# Patient Record
Sex: Female | Born: 1935
Health system: Southern US, Community
[De-identification: ages and names within clinical notes are randomized; demographics above are authoritative.]

## PROBLEM LIST (undated history)

## (undated) DIAGNOSIS — E119 Type 2 diabetes mellitus without complications: Secondary | ICD-10-CM

## (undated) DIAGNOSIS — N289 Disorder of kidney and ureter, unspecified: Secondary | ICD-10-CM

## (undated) DIAGNOSIS — E785 Hyperlipidemia, unspecified: Secondary | ICD-10-CM

## (undated) DIAGNOSIS — I10 Essential (primary) hypertension: Secondary | ICD-10-CM

## (undated) DIAGNOSIS — I4891 Unspecified atrial fibrillation: Secondary | ICD-10-CM

## (undated) DIAGNOSIS — G629 Polyneuropathy, unspecified: Secondary | ICD-10-CM

## (undated) DIAGNOSIS — J45909 Unspecified asthma, uncomplicated: Secondary | ICD-10-CM

---

## 2010-08-13 DEATH — deceased

## 2019-06-29 ENCOUNTER — Ambulatory Visit: Payer: Medicare Other | Attending: Internal Medicine

## 2019-06-29 DIAGNOSIS — Z23 Encounter for immunization: Secondary | ICD-10-CM

## 2019-06-29 NOTE — Progress Notes (Signed)
   Covid-19 Vaccination Clinic  Name:  Linda Turner    MRN: [id number] DOB: May 04, 1934  06/29/2019  Linda Turner was observed post Covid-19 immunization for 15 minutes without incidence. She was provided with Vaccine Information Sheet and instruction to access the V-Safe system.   Linda Turner was instructed to call 911 with any severe reactions post vaccine: Marland Kitchen Difficulty breathing  . Swelling of your face and throat  . A fast heartbeat  . A bad rash all over your body  . Dizziness and weakness    Immunizations Administered    Name Date Dose VIS Date Route   Pfizer COVID-19 Vaccine 06/29/2019  3:29 PM 0.3 mL 04/24/2019 Intramuscular   Manufacturer: [employer], [employer]   Lot: [id number]   NDC: [address]-8

## 2019-07-21 ENCOUNTER — Ambulatory Visit: Payer: Medicare Other | Attending: Internal Medicine

## 2019-07-21 DIAGNOSIS — Z23 Encounter for immunization: Secondary | ICD-10-CM | POA: Insufficient documentation

## 2019-07-21 NOTE — Progress Notes (Signed)
   Covid-19 Vaccination Clinic  Name:  Linda Turner    MRN: [id number] DOB: [birth year]  07/21/2019  [name] was observed post Covid-19 immunization for 15 minutes without incident. She was provided with Vaccine Information Sheet and instruction to access the V-Safe system.   [name] was instructed to call 911 with any severe reactions post vaccine: Marland Kitchen Difficulty breathing  . Swelling of face and throat  . A fast heartbeat  . A bad rash all over body  . Dizziness and weakness   Immunizations Administered    Name Date Dose VIS Date Route   Pfizer COVID-19 Vaccine 07/21/2019  9:05 AM 0.3 mL 04/24/2019 Intramuscular   Manufacturer: [employer], [employer]   Lot: [id number]   NDC: [address]-7

## 2019-07-22 ENCOUNTER — Ambulatory Visit: Payer: Self-pay

## 2019-12-09 ENCOUNTER — Encounter (HOSPITAL_COMMUNITY): Payer: Self-pay | Admitting: *Deleted

## 2019-12-09 ENCOUNTER — Emergency Department (HOSPITAL_COMMUNITY): Payer: Medicare Other

## 2019-12-09 ENCOUNTER — Other Ambulatory Visit: Payer: Self-pay

## 2019-12-09 ENCOUNTER — Emergency Department (HOSPITAL_COMMUNITY)
Admission: EM | Admit: 2019-12-09 | Discharge: 2019-12-09 | Disposition: A | Discharge status: Home / Self Care | Payer: Medicare Other | Attending: Emergency Medicine | Admitting: Emergency Medicine

## 2019-12-09 DIAGNOSIS — Z7901 Long term (current) use of anticoagulants: Secondary | ICD-10-CM | POA: Diagnosis not present

## 2019-12-09 DIAGNOSIS — I4891 Unspecified atrial fibrillation: Secondary | ICD-10-CM | POA: Insufficient documentation

## 2019-12-09 DIAGNOSIS — J45909 Unspecified asthma, uncomplicated: Secondary | ICD-10-CM | POA: Diagnosis not present

## 2019-12-09 DIAGNOSIS — I10 Essential (primary) hypertension: Secondary | ICD-10-CM | POA: Insufficient documentation

## 2019-12-09 DIAGNOSIS — E119 Type 2 diabetes mellitus without complications: Secondary | ICD-10-CM | POA: Insufficient documentation

## 2019-12-09 DIAGNOSIS — R112 Nausea with vomiting, unspecified: Secondary | ICD-10-CM | POA: Diagnosis not present

## 2019-12-09 DIAGNOSIS — R002 Palpitations: Secondary | ICD-10-CM | POA: Diagnosis present

## 2019-12-09 HISTORY — DX: Polyneuropathy, unspecified: G62.9

## 2019-12-09 HISTORY — DX: Hyperlipidemia, unspecified: E78.5

## 2019-12-09 HISTORY — DX: Essential (primary) hypertension: I10

## 2019-12-09 HISTORY — DX: Unspecified asthma, uncomplicated: J45.909

## 2019-12-09 HISTORY — DX: Type 2 diabetes mellitus without complications: E11.9

## 2019-12-09 HISTORY — DX: Disorder of kidney and ureter, unspecified: N28.9

## 2019-12-09 HISTORY — DX: Unspecified atrial fibrillation: I48.91

## 2019-12-09 LAB — BASIC METABOLIC PANEL
Anion gap: 9 (ref 5–15)
BUN: 16 mg/dL (ref 8–23)
CO2: 25 mmol/L (ref 22–32)
Calcium: 9.4 mg/dL (ref 8.9–10.3)
Chloride: 106 mmol/L (ref 98–111)
Creatinine, Ser: 1.25 mg/dL — ABNORMAL HIGH (ref 0.44–1.00)
GFR calc Af Amer: 46 mL/min — ABNORMAL LOW (ref >60)
GFR calc non Af Amer: 39 mL/min — ABNORMAL LOW (ref >60)
Glucose, Bld: 254 mg/dL — ABNORMAL HIGH (ref 70–99)
Potassium: 4.7 mmol/L (ref 3.5–5.1)
Sodium: 140 mmol/L (ref 135–145)

## 2019-12-09 LAB — CBC
HCT: 37.3 % (ref 36.0–46.0)
Hemoglobin: 11.1 g/dL — ABNORMAL LOW (ref 12.0–15.0)
MCH: 26.4 pg (ref 26.0–34.0)
MCHC: 29.8 g/dL — ABNORMAL LOW (ref 30.0–36.0)
MCV: 88.6 fL (ref 80.0–100.0)
Platelets: 157 10*3/uL (ref 150–400)
RBC: 4.21 MIL/uL (ref 3.87–5.11)
RDW: 14.1 % (ref 11.5–15.5)
WBC: 5.3 10*3/uL (ref 4.0–10.5)
nRBC: 0 % (ref 0.0–0.2)

## 2019-12-09 LAB — MAGNESIUM: Magnesium: 1.8 mg/dL (ref 1.7–2.4)

## 2019-12-09 MED ORDER — SODIUM CHLORIDE 0.9% FLUSH
3.0000 mL | Freq: Once | INTRAVENOUS | Status: AC
  Administered 2019-12-09: 3 mL via INTRAVENOUS

## 2019-12-09 NOTE — Discharge Instructions (Addendum)
You were seen today for palpitations.  Continue your Xarelto.  Take your medications as prescribed and follow-up closely with her primary physician.

## 2019-12-09 NOTE — ED Triage Notes (Signed)
Pt arrives via [facility] from home. After dinner, started having palpitations and SOB. Denied pain. Emesis x 1 PTA. Initial afib 90-180's. CBG 233. 112/66, current hr 70-110. She has an IV in the left hand. She was given Cardizem 10mg  x 2 doses. 98% RA. Alert and oriented.

## 2019-12-09 NOTE — ED Notes (Signed)
Patient transported to X-ray 

## 2019-12-09 NOTE — ED Provider Notes (Signed)
[facility] EMERGENCY DEPARTMENT Provider Note   CSN: [id number] Arrival date & time: 12/09/19  0109     History Chief Complaint  Patient presents with  . Palpitations    Linda Turner is a 86 y.o. female.  HPI     This an 86 year old female with a history of atrial fibrillation, diabetes, hypertension, hyperlipidemia who presents with palpitations.  Per patient, she ate dinner around 930.  She subsequently developed some palpitations and shortness of breath.  She states she did not have any chest pain at that time but did feel short of breath.  She had one episode of nonbilious, nonbloody emesis.  Per EMS she was noted to be in A. fib with rates up to the 180s.  She was given Cardizem 10 mg x 2 doses which she tolerated well.  She currently is asymptomatic.  No chest pain or shortness of breath.  Has not noted any recent illnesses.  No fevers or lower extremity swelling that is new.  She is on Xarelto and reports compliance.  Past Medical History:  Diagnosis Date  . A-fib (HCC)   . Asthma   . Diabetes mellitus without complication (HCC)   . HTN (hypertension)   . Hyperlipidemia   . Neuropathy   . Renal disorder     There are no problems to display for this patient.   OB History   No obstetric history on file.     No family history on file.  Social History   Tobacco Use  . Smoking status: Not on file  Substance Use Topics  . Alcohol use: Not on file  . Drug use: Not on file    Home Medications Prior to Admission medications   Not on File    Allergies    Ampicillin  Review of Systems   Review of Systems  Constitutional: Negative for fever.  Respiratory: Positive for shortness of breath. Negative for chest tightness.   Cardiovascular: Positive for palpitations. Negative for chest pain and leg swelling.  Gastrointestinal: Positive for nausea and vomiting. Negative for abdominal pain.  Genitourinary: Negative for dysuria.  All other  systems reviewed and are negative.   Physical Exam Updated Vital Signs BP (!) 145/72   Pulse 64   Temp 97.7 F (36.5 C) (Oral)   Resp 16   Ht 1.727 m (5\' 8" )   Wt 82.1 kg   SpO2 98%   BMI 27.52 kg/m   Physical Exam Vitals and nursing note reviewed.  Constitutional:      Appearance: She is well-developed.     Comments: Elderly, nontoxic-appearing  HENT:     Head: Normocephalic and atraumatic.     Nose: Nose normal.     Mouth/Throat:     Mouth: Mucous membranes are moist.  Eyes:     Pupils: Pupils are equal, round, and reactive to light.  Cardiovascular:     Rate and Rhythm: Normal rate and regular rhythm.     Heart sounds: Normal heart sounds. No murmur heard.   Pulmonary:     Effort: Pulmonary effort is normal. No respiratory distress.     Breath sounds: No wheezing.  Abdominal:     General: Bowel sounds are normal.     Palpations: Abdomen is soft.     Tenderness: There is no abdominal tenderness.  Musculoskeletal:     Cervical back: Neck supple.     Comments: Trace bilateral lower extremity edema  Skin:    General: Skin is warm and  dry.  Neurological:     Mental Status: She is alert and oriented to person, place, and time.  Psychiatric:        Mood and Affect: Mood normal.     ED Results / Procedures / Treatments   Labs (all labs ordered are listed, but only abnormal results are displayed) Labs Reviewed  BASIC METABOLIC PANEL - Abnormal; Notable for the following components:      Result Value   Glucose, Bld 254 (*)    Creatinine, Ser 1.25 (*)    GFR calc non Af Amer 39 (*)    GFR calc Af Amer 46 (*)    All other components within normal limits  CBC - Abnormal; Notable for the following components:   Hemoglobin 11.1 (*)    MCHC 29.8 (*)    All other components within normal limits  MAGNESIUM    EKG EKG Interpretation  Date/Time:  Wednesday December 09 2019 01:14:14 EDT Ventricular Rate:  83 PR Interval:    QRS Duration: 84 QT Interval:  380 QTC  Calculation: 446 R Axis:   14 Text Interpretation: Atrial fibrillation Cannot rule out Anterior infarct , age undetermined Abnormal ECG Confirmed by Ross Marcus ([id number]) on 12/09/2019 1:27:31 AM   EKG Interpretation  Date/Time:  Wednesday December 09 2019 02:04:41 EDT Ventricular Rate:  69 PR Interval:    QRS Duration: 88 QT Interval:  404 QTC Calculation: 433 R Axis:   32 Text Interpretation: Sinus rhythm Low voltage, precordial leads Confirmed by Ross Marcus ([id number]) on 12/09/2019 3:17:58 AM        Radiology DG Chest 2 View  Result Date: 12/09/2019 CLINICAL DATA:  AFib EXAM: CHEST - 2 VIEW COMPARISON:  June 21, 2017 FINDINGS: The heart size and mediastinal contours are within normal limits. Aortic knob calcifications are seen. Both lungs are clear. The visualized skeletal structures are unremarkable. IMPRESSION: No active cardiopulmonary disease. Electronically Signed   By: Jonna Clark M.D.   On: 12/09/2019 02:01    Procedures Procedures (including critical care time)  Medications Ordered in ED Medications  sodium chloride flush (NS) 0.9 % injection 3 mL (3 mLs Intravenous Given 12/09/19 0201)    ED Course  I have reviewed the triage vital signs and the nursing notes.  Pertinent labs & imaging results that were available during my care of the patient were reviewed by me and considered in my medical decision making (see chart for details).    MDM Rules/Calculators/A&P                          Patient presents with palpitations.  Noted to be in atrial fibrillation with RVR.  Was treated with bolus x2 by EMS and appears to be in sinus rhythm on my assessment.  She is currently asymptomatic.  She is overall nontoxic-appearing and vital signs are reassuring.  Heart rate is in the 60s.  She denies any chest pain or any other significant complaints.  Suspect her palpitations likely related to noted atrial fibrillation by EMS.  Will obtain labs to evaluate for metabolic  derangements or other obvious abnormalities.  She has no evidence of ischemia on her EKG and do not feel she needs a troponin as she is otherwise asymptomatic.  Chest x-ray does not show any evidence of pneumothorax or pneumonia.  Basic lab work notable for mild hyperglycemia and a creatinine of 1.25.  Patient has remained hemodynamically stable and in sinus rhythm while in the  emergency department.  She is asymptomatic.  Feel she is safe for discharge home.  Encouraged to continue her Xarelto and rate controlling medications and close follow-up with her primary physician.  This patients CHA2DS2-VASc Score and unadjusted Ischemic Stroke Rate (% per year) is equal to 7.2 % stroke rate/year from a score of 5  Above score calculated as 1 point each if present [CHF, HTN, DM, Vascular=MI/PAD/Aortic Plaque, Age if 65-74, or Female] Above score calculated as 2 points each if present [Age > 75, or Stroke/TIA/TE]  Final Clinical Impression(s) / ED Diagnoses Final diagnoses:  Atrial fibrillation with RVR (HCC)    Rx / DC Orders ED Discharge Orders         Ordered    Amb referral to AFIB Clinic     Discontinue  Reprint     12/09/19 0128           Shon Baton, MD 12/09/19 [phone]

## 2019-12-16 ENCOUNTER — Ambulatory Visit (HOSPITAL_COMMUNITY): Payer: Medicare Other | Admitting: Physician Assistant

## 2020-04-19 ENCOUNTER — Other Ambulatory Visit (HOSPITAL_BASED_OUTPATIENT_CLINIC_OR_DEPARTMENT_OTHER): Payer: Self-pay | Admitting: Internal Medicine

## 2020-04-19 ENCOUNTER — Ambulatory Visit: Payer: Medicare Other

## 2020-04-26 MED FILL — PFIZER-BIONTECH COVID-19 VA: 30 | 1 days supply | Qty: 0 | Fill #0

## 2020-05-02 ENCOUNTER — Ambulatory Visit: Payer: Medicare Other

## 2020-05-03 ENCOUNTER — Ambulatory Visit: Payer: Medicare Other

## 2022-01-11 IMAGING — CR DG CHEST 2V
1 series · 1 of 1 positions shown · non-contrast
Comparison: June 21, 2017

CLINICAL DATA: AFib

EXAM:
CHEST - 2 VIEW

[chest pa]
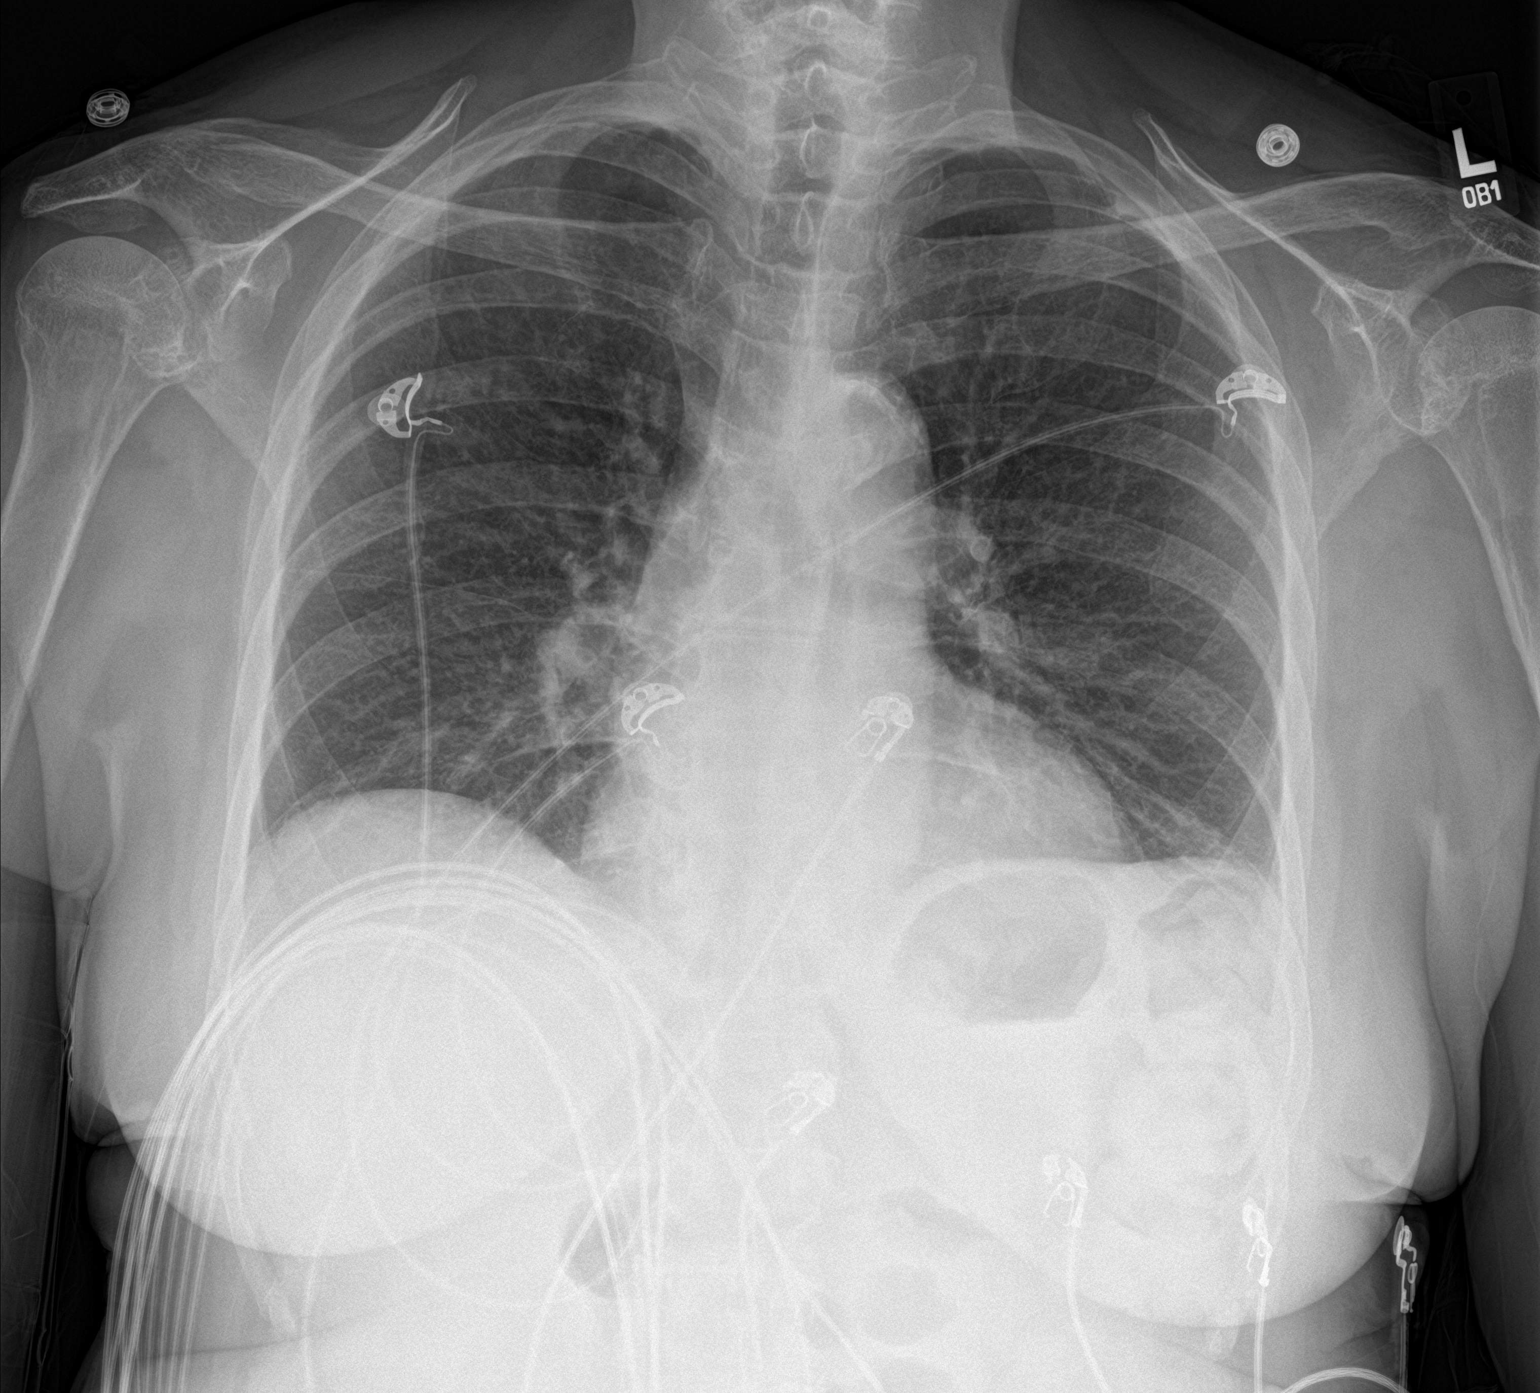

[1 of 1 positions shown; findings below may reference images not displayed]

FINDINGS: The heart size and mediastinal contours are within normal limits.
Aortic knob calcifications are seen. Both lungs are clear. The
visualized skeletal structures are unremarkable.
IMPRESSION: No active cardiopulmonary disease.

## 2022-03-29 ENCOUNTER — Emergency Department (HOSPITAL_BASED_OUTPATIENT_CLINIC_OR_DEPARTMENT_OTHER): Payer: Medicare Other

## 2022-03-29 ENCOUNTER — Emergency Department (HOSPITAL_BASED_OUTPATIENT_CLINIC_OR_DEPARTMENT_OTHER)
Admission: EM | Admit: 2022-03-29 | Discharge: 2022-03-29 | Disposition: A | Discharge status: Home / Self Care | Payer: Medicare Other | Attending: Emergency Medicine | Admitting: Emergency Medicine

## 2022-03-29 ENCOUNTER — Encounter (HOSPITAL_BASED_OUTPATIENT_CLINIC_OR_DEPARTMENT_OTHER): Payer: Self-pay

## 2022-03-29 DIAGNOSIS — M19072 Primary osteoarthritis, left ankle and foot: Secondary | ICD-10-CM | POA: Diagnosis not present

## 2022-03-29 DIAGNOSIS — I10 Essential (primary) hypertension: Secondary | ICD-10-CM | POA: Insufficient documentation

## 2022-03-29 DIAGNOSIS — R799 Abnormal finding of blood chemistry, unspecified: Secondary | ICD-10-CM | POA: Insufficient documentation

## 2022-03-29 DIAGNOSIS — M19079 Primary osteoarthritis, unspecified ankle and foot: Secondary | ICD-10-CM

## 2022-03-29 DIAGNOSIS — M79672 Pain in left foot: Secondary | ICD-10-CM | POA: Diagnosis present

## 2022-03-29 LAB — CBG MONITORING, ED: Glucose-Capillary: 86 mg/dL (ref 70–99)

## 2022-03-29 MED ORDER — HYDROCHLOROTHIAZIDE 25 MG PO TABS
25.0000 mg | ORAL_TABLET | Freq: Once | ORAL | Status: AC
  Administered 2022-03-29: 25 mg via ORAL
  Filled 2022-03-29: qty 1

## 2022-03-29 MED ORDER — LOSARTAN POTASSIUM 25 MG PO TABS
50.0000 mg | ORAL_TABLET | Freq: Once | ORAL | Status: AC
  Administered 2022-03-29: 50 mg via ORAL
  Filled 2022-03-29: qty 2

## 2022-03-29 NOTE — ED Triage Notes (Signed)
Patient c/o left foot pain x 2 months, strong pedal pulse. Denies injury. Also states when she swallows she has been getting choked up for the past 2 months. NAD during triage.  Hypertensive during triage, states taking meds, denies symptoms.

## 2022-03-29 NOTE — Discharge Instructions (Addendum)
Please take your blood pressure medicine as prescribed.  Please recheck your blood pressure with your doctor in a week  As we discussed, you have bad arthritis in your foot causing your pain.  Please call your podiatrist, Dr. Romualdo Bolk, tomorrow for appointment  Return to ER if you have worse foot pain, chest pain, trouble breathing, trouble swallowing

## 2022-03-29 NOTE — ED Notes (Signed)
Reviewed discharge instructions and recommendations to follow up with podiatrist. Pt states understanding. Pt ambulatory upon discharge .

## 2022-03-29 NOTE — ED Provider Notes (Signed)
[facility] EMERGENCY DEPARTMENT Provider Note   CSN: [id number] Arrival date & time: 03/29/22  1318     History  Chief Complaint  Patient presents with   Foot Pain    Linda Turner is a 88 y.o. female hx of bunion, hypertension, foot arthritis here presenting with left foot pain.  Patient states that she has been having left foot pain for the last 2 months.  Patient denies any trauma or injury.  Patient has a history of reflux and she told the nurse that she is getting worsening reflux symptoms but denies it to me.  Patient has a history of hypertension and did not take her blood pressure medicines today.  Denies any chest pain or shortness of breath.  Patient has a podiatrist that she follows up for her foot pain  The history is provided by the patient.       Home Medications Prior to Admission medications   Not on File      Allergies    Ampicillin    Review of Systems   Review of Systems  Musculoskeletal:        L foot pain   All other systems reviewed and are negative.   Physical Exam Updated Vital Signs BP (!) 180/83 (BP Location: Right Arm)   Pulse (!) 55   Temp 98.2 F (36.8 C) (Oral)   Resp 16   Ht 5\' 8"  (1.727 m)   Wt 72.6 kg   SpO2 98%   BMI 24.33 kg/m  Physical Exam Vitals and nursing note reviewed.  Constitutional:      Appearance: Normal appearance.     Comments: Chronically ill but not acutely ill   HENT:     Head: Normocephalic.     Nose: Nose normal.     Mouth/Throat:     Mouth: Mucous membranes are moist.  Eyes:     Extraocular Movements: Extraocular movements intact.     Pupils: Pupils are equal, round, and reactive to light.  Cardiovascular:     Rate and Rhythm: Normal rate and regular rhythm.     Pulses: Normal pulses.  Pulmonary:     Effort: Pulmonary effort is normal.     Breath sounds: Normal breath sounds.  Abdominal:     General: Abdomen is flat.     Palpations: Abdomen is soft.  Musculoskeletal:     Cervical  back: Normal range of motion and neck supple.     Comments: No obvious foot tenderness.  2+ DP pulse bilateral foot.  No calf tenderness.  Neurological:     General: No focal deficit present.     Mental Status: She is alert.  Psychiatric:        Mood and Affect: Mood normal.        Behavior: Behavior normal.     ED Results / Procedures / Treatments   Labs (all labs ordered are listed, but only abnormal results are displayed) Labs Reviewed  CBG MONITORING, ED    EKG None  Radiology DG Foot Complete Left  Result Date: 03/29/2022 CLINICAL DATA:  Left foot pain EXAM: LEFT FOOT - COMPLETE 3+ VIEW COMPARISON:  None Available. FINDINGS: No recent fracture is seen. Deformity in the head of the first metatarsal may be residual from previous injury or previous surgery. Degenerative changes are noted in first metatarsophalangeal joint. There is dislocation in PIP joint of the fifth toe which may be chronic. Small plantar spur is seen in calcaneus. IMPRESSION: No recent fracture or  dislocation is seen. Degenerative changes are noted in first metatarsophalangeal joint. Small plantar spur is seen in calcaneus. Electronically Signed   By: Ernie Avena M.D.   On: 03/29/2022 14:01    Procedures Procedures    Medications Ordered in ED Medications  hydrochlorothiazide (HYDRODIURIL) tablet 25 mg (has no administration in time range)  losartan (COZAAR) tablet 50 mg (has no administration in time range)    ED Course/ Medical Decision Making/ A&P                           Medical Decision Making Linda Turner is a 88 y.o. female here with foot pain.  Patient has chronic foot pain that got worse.  Patient is neurovascular intact.  X-ray showed arthritis and no fracture.  Patient has no trauma or injury.  Of note, patient is hypertensive but did not take her BP meds.  She has no chest pain or shortness of breath.  She told the nurse she has some trouble swallowing but denies that for me.  She was  able to swallow her meds.  I told her that she needs to take her blood pressure medicine as prescribed and follow-up with her doctor in the week.   Problems Addressed: Arthritis of foot: acute illness or injury Hypertension, unspecified type: acute illness or injury  Amount and/or Complexity of Data Reviewed Labs: ordered. Decision-making details documented in ED Course. Radiology: ordered and independent interpretation performed. Decision-making details documented in ED Course.  Risk Prescription drug management.    Final Clinical Impression(s) / ED Diagnoses Final diagnoses:  None    Rx / DC Orders ED Discharge Orders     None         Charlynne Pander, MD 03/29/22 [phone]

## 2022-03-29 NOTE — ED Notes (Signed)
ED Provider at bedside. 

## 2022-03-29 NOTE — ED Notes (Signed)
Pt able to stand and pivot from locked wheelchair to lowered and locked stretcher via x1 MIN assist, in NAD

## 2023-03-22 ENCOUNTER — Emergency Department (HOSPITAL_BASED_OUTPATIENT_CLINIC_OR_DEPARTMENT_OTHER): Payer: Medicare Other

## 2023-03-22 ENCOUNTER — Encounter (HOSPITAL_BASED_OUTPATIENT_CLINIC_OR_DEPARTMENT_OTHER): Payer: Self-pay

## 2023-03-22 ENCOUNTER — Emergency Department (HOSPITAL_BASED_OUTPATIENT_CLINIC_OR_DEPARTMENT_OTHER)
Admission: EM | Admit: 2023-03-22 | Discharge: 2023-03-23 | Disposition: A | Discharge status: Other Acute Inpatient Hospital | Payer: Medicare Other | Attending: Emergency Medicine | Admitting: Emergency Medicine

## 2023-03-22 ENCOUNTER — Other Ambulatory Visit: Payer: Self-pay

## 2023-03-22 DIAGNOSIS — R21 Rash and other nonspecific skin eruption: Secondary | ICD-10-CM | POA: Insufficient documentation

## 2023-03-22 DIAGNOSIS — I10 Essential (primary) hypertension: Secondary | ICD-10-CM | POA: Diagnosis not present

## 2023-03-22 DIAGNOSIS — E119 Type 2 diabetes mellitus without complications: Secondary | ICD-10-CM | POA: Diagnosis not present

## 2023-03-22 DIAGNOSIS — R002 Palpitations: Secondary | ICD-10-CM | POA: Diagnosis present

## 2023-03-22 DIAGNOSIS — I4891 Unspecified atrial fibrillation: Secondary | ICD-10-CM | POA: Insufficient documentation

## 2023-03-22 DIAGNOSIS — D649 Anemia, unspecified: Secondary | ICD-10-CM | POA: Diagnosis not present

## 2023-03-22 DIAGNOSIS — R410 Disorientation, unspecified: Secondary | ICD-10-CM | POA: Diagnosis not present

## 2023-03-22 LAB — CBC
HCT: 33 % — ABNORMAL LOW (ref 36.0–46.0)
Hemoglobin: 10.3 g/dL — ABNORMAL LOW (ref 12.0–15.0)
MCH: 26.3 pg (ref 26.0–34.0)
MCHC: 31.2 g/dL (ref 30.0–36.0)
MCV: 84.2 fL (ref 80.0–100.0)
Platelets: 183 10*3/uL (ref 150–400)
RBC: 3.92 MIL/uL (ref 3.87–5.11)
RDW: 14.9 % (ref 11.5–15.5)
WBC: 4 10*3/uL (ref 4.0–10.5)
nRBC: 0 % (ref 0.0–0.2)

## 2023-03-22 LAB — TROPONIN I (HIGH SENSITIVITY)
Troponin I (High Sensitivity): 16 ng/L (ref <18)
Troponin I (High Sensitivity): 24 ng/L — ABNORMAL HIGH (ref <18)

## 2023-03-22 LAB — BASIC METABOLIC PANEL
Anion gap: 10 (ref 5–15)
BUN: 21 mg/dL (ref 8–23)
CO2: 23 mmol/L (ref 22–32)
Calcium: 8.4 mg/dL — ABNORMAL LOW (ref 8.9–10.3)
Chloride: 106 mmol/L (ref 98–111)
Creatinine, Ser: 1.08 mg/dL — ABNORMAL HIGH (ref 0.44–1.00)
GFR, Estimated: 50 mL/min — ABNORMAL LOW (ref >60)
Glucose, Bld: 156 mg/dL — ABNORMAL HIGH (ref 70–99)
Potassium: 3.6 mmol/L (ref 3.5–5.1)
Sodium: 139 mmol/L (ref 135–145)

## 2023-03-22 LAB — TSH: TSH: 0.855 u[IU]/mL (ref 0.350–4.500)

## 2023-03-22 MED ORDER — DILTIAZEM LOAD VIA INFUSION
20.0000 mg | Freq: Once | INTRAVENOUS | Status: AC
  Administered 2023-03-22: 20 mg via INTRAVENOUS
  Filled 2023-03-22: qty 20

## 2023-03-22 MED ORDER — HEPARIN (PORCINE) 25000 UT/250ML-% IV SOLN
1000.0000 [IU]/h | INTRAVENOUS | Status: DC
  Administered 2023-03-22: 1000 [IU]/h via INTRAVENOUS
  Filled 2023-03-22: qty 250

## 2023-03-22 MED ORDER — IOHEXOL 350 MG/ML SOLN
100.0000 mL | Freq: Once | INTRAVENOUS | Status: AC | PRN
  Administered 2023-03-22: 75 mL via INTRAVENOUS

## 2023-03-22 MED ORDER — DILTIAZEM HCL-DEXTROSE 125-5 MG/125ML-% IV SOLN (PREMIX)
5.0000 mg/h | INTRAVENOUS | Status: DC
  Administered 2023-03-22: 5 mg/h via INTRAVENOUS
  Filled 2023-03-22: qty 125

## 2023-03-22 MED ORDER — HEPARIN BOLUS VIA INFUSION
3500.0000 [IU] | Freq: Once | INTRAVENOUS | Status: AC
  Administered 2023-03-22: 3500 [IU] via INTRAVENOUS

## 2023-03-22 NOTE — ED Provider Notes (Cosign Needed Addendum)
[facility] Provider Note   CSN: [id number] Arrival date & time: 03/22/23  1716     History  Chief Complaint  Patient presents with   Palpitations    Linda Turner is a 89 y.o. female with a past medical history of atrial fibrillation, HTN, DM, and HLD who presented to emergency department with palpitations that started a few hours ago.  Patient is unsure if she took her Cardizem today.  Patient stated that she had chest pain earlier today but denies chest pain at the moment, she denies shortness of breath and dizziness.  Patient denies recent illnesses and also denies taking blood thinners. Her last dispense h/o of Xarelto was in June 2023.   Patient presented with her sister and a caregiver who report an increase in confusion in the patient.  Patient also has a bag of medications with her with prescription medications that are greater than 69-year-old and an empty bottle for metoprolol.    Home Medications Prior to Admission medications   Not on File      Allergies    Ampicillin    Review of Systems   Review of Systems  Respiratory:  Negative for shortness of breath.   Cardiovascular:  Positive for palpitations. Negative for chest pain (Earlier, resolved) and leg swelling.  Neurological:  Negative for dizziness and light-headedness.    Physical Exam Updated Vital Signs BP (!) 127/91   Pulse 79   Temp 98.7 F (37.1 C) (Oral)   Resp 11   Ht 5\' 8"  (1.727 m)   Wt 72 kg   SpO2 99%   BMI 24.14 kg/m  Physical Exam Vitals and nursing note reviewed.  Constitutional:      General: She is not in acute distress.    Appearance: She is normal weight. She is not ill-appearing, toxic-appearing or diaphoretic.  Cardiovascular:     Rate and Rhythm: Tachycardia present. Rhythm irregularly irregular.     Heart sounds: Normal heart sounds. No murmur heard. Pulmonary:     Effort: Pulmonary effort is normal. No respiratory distress.      Breath sounds: Normal breath sounds.  Abdominal:     Palpations: Abdomen is soft.     Tenderness: There is no abdominal tenderness.  Musculoskeletal:     Right lower leg: No edema.     Left lower leg: No edema.  Skin:    General: Skin is warm and dry.     Findings: Rash (B/L venous stasis dermatitis present) present.  Neurological:     Mental Status: She is alert.     ED Results / Procedures / Treatments   Labs (all labs ordered are listed, but only abnormal results are displayed) Labs Reviewed  BASIC METABOLIC PANEL - Abnormal; Notable for the following components:      Result Value   Glucose, Bld 156 (*)    Creatinine, Ser 1.08 (*)    Calcium 8.4 (*)    GFR, Estimated 50 (*)    All other components within normal limits  CBC - Abnormal; Notable for the following components:   Hemoglobin 10.3 (*)    HCT 33.0 (*)    All other components within normal limits  TROPONIN I (HIGH SENSITIVITY) - Abnormal; Notable for the following components:   Troponin I (High Sensitivity) 24 (*)    All other components within normal limits  TSH  HEPARIN LEVEL (UNFRACTIONATED)  TROPONIN I (HIGH SENSITIVITY)    EKG EKG Interpretation Date/Time:  Friday March 22 2023 17:26:49 EST Ventricular Rate:  144 PR Interval:    QRS Duration:  89 QT Interval:  308 QTC Calculation: 477 R Axis:   26  Text Interpretation: Atrial fibrillation with rapid V-rate hx of paroxysmal afib.   Now in afib. Confirmed by Jacalyn Lefevre [phone]) on 03/22/2023 6:10:42 PM  Radiology CT Angio Chest PE W/Cm &/Or Wo Cm  Result Date: 03/22/2023 CLINICAL DATA:  Chest pain. EXAM: CT ANGIOGRAPHY CHEST WITH CONTRAST TECHNIQUE: Multidetector CT imaging of the chest was performed using the standard protocol during bolus administration of intravenous contrast. Multiplanar CT image reconstructions and MIPs were obtained to evaluate the vascular anatomy. RADIATION DOSE REDUCTION: This exam was performed according to the  departmental dose-optimization program which includes automated exposure control, adjustment of the mA and/or kV according to patient size and/or use of iterative reconstruction technique. CONTRAST:  75mL OMNIPAQUE IOHEXOL 350 MG/ML SOLN COMPARISON:  May 02, 2017 FINDINGS: Cardiovascular: There is marked severity calcification of the aortic arch, without evidence of aortic aneurysm. Satisfactory opacification of the pulmonary arteries to the segmental level. No evidence of pulmonary embolism. Normal heart size with marked severity coronary artery calcification. No pericardial effusion. Mediastinum/Nodes: There is mild right hilar lymphadenopathy. Thyroid gland, trachea, and esophagus demonstrate no significant findings. Lungs/Pleura: Numerous small, predominately peripheral nodular appearing multifocal infiltrates are seen throughout the bilateral upper lobes and bilateral lower lobes. No pleural effusion or pneumothorax is identified. Upper Abdomen: No acute abnormality. Musculoskeletal: Multilevel degenerative changes are seen throughout the thoracic spine Review of the MIP images confirms the above findings. IMPRESSION: 1. No evidence of pulmonary embolism. 2. Numerous small, predominately peripheral nodular appearing multifocal infiltrates throughout the bilateral upper lobes and bilateral lower lobes. Follow-up to resolution is recommended to exclude the presence of an underlying neoplastic process. 3. Mild right hilar lymphadenopathy, likely reactive in nature. 4. Aortic atherosclerosis. Aortic Atherosclerosis (ICD10-I70.0). Electronically Signed   By: Aram Candela M.D.   On: 03/22/2023 21:53   DG Chest Portable 1 View  Result Date: 03/22/2023 CLINICAL DATA:  Palpitations and confusion. EXAM: PORTABLE CHEST 1 VIEW COMPARISON:  August 09, 2022 FINDINGS: The heart size and mediastinal contours are within normal limits. There is marked severity calcification of the aortic arch. Low lung volumes are  seen with mild atelectasis noted within the bilateral lung bases. No pleural effusion or pneumothorax is identified. The visualized skeletal structures are unremarkable. IMPRESSION: Low lung volumes with mild bibasilar atelectasis. Electronically Signed   By: Aram Candela M.D.   On: 03/22/2023 21:40   CT Head Wo Contrast  Result Date: 03/22/2023 CLINICAL DATA:  Confusion with atrial fibrillation EXAM: CT HEAD WITHOUT CONTRAST TECHNIQUE: Contiguous axial images were obtained from the base of the skull through the vertex without intravenous contrast. RADIATION DOSE REDUCTION: This exam was performed according to the departmental dose-optimization program which includes automated exposure control, adjustment of the mA and/or kV according to patient size and/or use of iterative reconstruction technique. COMPARISON:  CT head 06/21/2017 FINDINGS: Brain: No intracranial hemorrhage, mass effect, or evidence of acute infarct. No hydrocephalus. No extra-axial fluid collection. Age-commensurate cerebral atrophy and chronic small vessel ischemic disease. Vascular: No hyperdense vessel. Intracranial arterial calcification. Skull: No fracture or focal lesion. Sinuses/Orbits: No acute finding. Other: None. IMPRESSION: No acute intracranial abnormality. Electronically Signed   By: Minerva Fester M.D.   On: 03/22/2023 21:37    Procedures Procedures    Medications Ordered in ED Medications  diltiazem (CARDIZEM) 1 mg/mL load  via infusion 20 mg (20 mg Intravenous Bolus from Bag 03/22/23 1801)    And  diltiazem (CARDIZEM) 125 mg in dextrose 5% 125 mL (1 mg/mL) infusion (5 mg/hr Intravenous New Bag/Given 03/22/23 1800)  heparin ADULT infusion 100 units/mL (25000 units/267mL) (1,000 Units/hr Intravenous New Bag/Given 03/22/23 1819)  heparin bolus via infusion 3,500 Units (3,500 Units Intravenous Bolus from Bag 03/22/23 1819)  iohexol (OMNIPAQUE) 350 MG/ML injection 100 mL (75 mLs Intravenous Contrast Given 03/22/23 2047)     ED Course/ Medical Decision Making/ A&P                                 Medical Decision Making Patient is an 89 year old female with a history of Atrial fibrillation who is noncompliant with rate control and anticoagulation medications likely from decreased cognition who presented to the ED for palpitations. Vitals on presentation are tachycardic at 142, blood pressure 126/84, afebrile, and O2 saturation of 98% on RA. Psychical exam was remarkable for an irregularly irregular rhythm. Patient was diagnosed with atrial fibrillation with rapid ventricular response, due to noncompliance with blood thinners, patient was not cardioverted. We started a Cardizem bolus and drip, along with a heparin infusion for anticoagulation.   Initial work up: CBC showed anemia with Hgb of 10.3 without leukocytosis, BMP showed Scr of 1.08, troponin 16, TSH pending, CXR showed low lung volumes with mild bibasilar atelectasis, and EKG showed Afib with RVR. Additional testing per request of Dr. Larey Brick at [facility]: head CT non contrast did not show acute intracranial abnormality and chest CTA did not show a PE but did show numerous small, predominately peripheral nodular appearing multifocal infiltrates throughout the bilateral upper lobes and bilateral lower lobes. Follow-up to resolution is recommended to exclude the presence of an underlying neoplastic process.  Patient will be admitted for further management of Afib with RVR pending [facility] acceptance.   Amount and/or Complexity of Data Reviewed Labs: ordered.    Details: CBC: Hgb 10.3, no leukocytosis BMP: Scr 1.08 Troponin: 16 TSH: 0.855  Radiology: ordered and independent interpretation performed.    Details: Agree with radiology findings, pt will need follow up chest imaging due to numerous nodular appearing infiltrates  ECG/medicine tests: ordered and independent interpretation performed.    Details: Afib with  RVR  Risk Prescription drug management.          Final Clinical Impression(s) / ED Diagnoses Final diagnoses:  Atrial fibrillation with rapid ventricular response [facility])    Rx / DC Orders ED Discharge Orders     None         Faith Rogue, DO 03/22/23 2239    Faith Rogue, DO 03/22/23 2317    Jacalyn Lefevre, MD 03/23/23 1443

## 2023-03-22 NOTE — ED Triage Notes (Addendum)
The patient stated her heart was racing. No chest pain or shortness of breath. Her family stated she has had increased confusion since she woke up today. She stated the patient was complaining of chest pain earlier.

## 2023-03-22 NOTE — ED Notes (Signed)
Report attempted to [facility] x 1.

## 2023-03-22 NOTE — Discharge Instructions (Addendum)
You came into the hospital with palpitations and you are diagnosed with atrial fibrillation with rapid ventricular response which is an abnormal rhythm of the heart.  You were treated with Cardizem bolus and infusion.  You were for admitted for further management of the abnormal heart rhythm. Please follow up with your PCP after you are discharged from the hospital. You will need follow up imaging of you lungs.

## 2023-03-22 NOTE — ED Notes (Signed)
BP 102/81 (90). Dr. Particia Nearing informed and stated it is okay to continue with the administration of the Cardizem infusion and the bolus of 20mg .

## 2023-03-22 NOTE — Progress Notes (Signed)
PHARMACY - ANTICOAGULATION CONSULT NOTE  Pharmacy Consult for heparin Indication: atrial fibrillation  Allergies  Allergen Reactions   Ampicillin Diarrhea    Patient Measurements: Height: 5\' 8"  (172.7 cm) Weight: 72 kg (158 lb 11.7 oz) IBW/kg (Calculated) : 63.9 Heparin Dosing Weight: TBW  Vital Signs: Temp: 97.7 F (36.5 C) (11/08 1724) Temp Source: Oral (11/08 1724) BP: 112/87 (11/08 1800) Pulse Rate: 133 (11/08 1800)  Labs: Recent Labs    03/22/23 1753  HGB 10.3*  HCT 33.0*  PLT 183    CrCl cannot be calculated (Patient's most recent lab result is older than the maximum 21 days allowed.).   Medical History: Past Medical History:  Diagnosis Date   A-fib (HCC)    Asthma    Diabetes mellitus without complication (HCC)    HTN (hypertension)    Hyperlipidemia    Neuropathy    Renal disorder     Assessment: 73 YOF presenting with palpitations, in afib, hx of same noncompliant with anticoagulation and rate control medications.    Goal of Therapy:  Heparin level 0.3-0.7 units/ml Monitor platelets by anticoagulation protocol: Yes   Plan:  Heparin 3500 units IV x 1, and gtt at 1000 units/hr F/u 8 hour heparin level F/u long term [facility] plan  Daylene Posey, PharmD, [facility] Clinical Pharmacist ED Pharmacist Phone # [phone] 03/22/2023 6:07 PM

## 2023-03-23 NOTE — ED Notes (Signed)
Heparin level transport not called before this RN was assigned to pt. Lab called, transport otw

## 2023-03-23 NOTE — ED Notes (Signed)
[facility] arrived for pt before heparin level transport arrived, [facility] notified

## 2023-10-01 ENCOUNTER — Other Ambulatory Visit (HOSPITAL_COMMUNITY): Payer: Self-pay

## 2023-10-03 ENCOUNTER — Other Ambulatory Visit (HOSPITAL_COMMUNITY): Payer: Self-pay
